# Patient Record
Sex: Male | Born: 1990 | Race: White | Hispanic: No | Marital: Single | Smoking: Former smoker
Health system: Southern US, Community
[De-identification: ages and names within clinical notes are randomized; demographics above are authoritative.]

---
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
## 2011-04-14 ENCOUNTER — Encounter: Payer: Self-pay | Admitting: Emergency Medicine

## 2011-04-14 ENCOUNTER — Emergency Department (HOSPITAL_COMMUNITY)
Admission: EM | Admit: 2011-04-14 | Discharge: 2011-04-14 | Disposition: A | Discharge status: Home / Self Care | Payer: BC Managed Care – PPO | Attending: Emergency Medicine | Admitting: Emergency Medicine

## 2011-04-14 ENCOUNTER — Emergency Department (HOSPITAL_COMMUNITY): Payer: BC Managed Care – PPO

## 2011-04-14 DIAGNOSIS — S20219A Contusion of unspecified front wall of thorax, initial encounter: Secondary | ICD-10-CM

## 2011-04-14 DIAGNOSIS — Y92410 Unspecified street and highway as the place of occurrence of the external cause: Secondary | ICD-10-CM | POA: Insufficient documentation

## 2011-04-14 DIAGNOSIS — R079 Chest pain, unspecified: Secondary | ICD-10-CM | POA: Insufficient documentation

## 2011-04-14 MED ORDER — NAPROXEN 500 MG PO TABS: 500.0000 mg | ORAL_TABLET | Freq: Two times a day (BID) | ORAL | Status: AC
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
## 2011-04-23 LAB — BASIC METABOLIC PANEL
CO2: 20
Calcium: 9.3
Chloride: 106
Glucose, Bld: 108 — ABNORMAL HIGH
Sodium: 136

## 2011-04-23 LAB — CBC
HCT: 44.1
Hemoglobin: 15.5
MCV: 90.8
Platelets: 262
WBC: 8.5

## 2011-04-23 LAB — DIFFERENTIAL
Basophils Relative: 0
Eosinophils Absolute: 0.1
Eosinophils Relative: 1
Lymphs Abs: 2.8
Monocytes Relative: 7
Neutrophils Relative %: 59

## 2013-09-22 ENCOUNTER — Emergency Department (HOSPITAL_COMMUNITY)
Admission: EM | Admit: 2013-09-22 | Discharge: 2013-09-22 | Disposition: A | Discharge status: Home / Self Care | Payer: Managed Care, Other (non HMO) | Attending: Emergency Medicine | Admitting: Emergency Medicine

## 2013-09-22 ENCOUNTER — Encounter (HOSPITAL_COMMUNITY): Payer: Self-pay | Admitting: Emergency Medicine

## 2013-09-22 DIAGNOSIS — R112 Nausea with vomiting, unspecified: Secondary | ICD-10-CM

## 2013-09-22 DIAGNOSIS — R5381 Other malaise: Secondary | ICD-10-CM | POA: Insufficient documentation

## 2013-09-22 DIAGNOSIS — R000 Tachycardia, unspecified: Secondary | ICD-10-CM | POA: Insufficient documentation

## 2013-09-22 DIAGNOSIS — R197 Diarrhea, unspecified: Secondary | ICD-10-CM | POA: Insufficient documentation

## 2013-09-22 DIAGNOSIS — R109 Unspecified abdominal pain: Secondary | ICD-10-CM

## 2013-09-22 DIAGNOSIS — E860 Dehydration: Secondary | ICD-10-CM | POA: Insufficient documentation

## 2013-09-22 DIAGNOSIS — R6883 Chills (without fever): Secondary | ICD-10-CM | POA: Insufficient documentation

## 2013-09-22 DIAGNOSIS — R51 Headache: Secondary | ICD-10-CM | POA: Insufficient documentation

## 2013-09-22 DIAGNOSIS — R1032 Left lower quadrant pain: Secondary | ICD-10-CM | POA: Insufficient documentation

## 2013-09-22 DIAGNOSIS — R5383 Other fatigue: Secondary | ICD-10-CM

## 2013-09-22 LAB — CBC WITH DIFFERENTIAL/PLATELET
BASOS ABS: 0 10*3/uL (ref 0.0–0.1)
BASOS PCT: 0 % (ref 0–1)
BASOS PCT: 0 % (ref 0–1)
Basophils Absolute: 0 10*3/uL (ref 0.0–0.1)
EOS ABS: 0 10*3/uL (ref 0.0–0.7)
EOS ABS: 0 10*3/uL (ref 0.0–0.7)
EOS PCT: 0 % (ref 0–5)
Eosinophils Relative: 0 % (ref 0–5)
HEMATOCRIT: 50.4 % (ref 39.0–52.0)
HEMATOCRIT: 48.5 % (ref 39.0–52.0)
Hemoglobin: 18 g/dL — ABNORMAL HIGH (ref 13.0–17.0)
Hemoglobin: 16.9 g/dL (ref 13.0–17.0)
LYMPHS PCT: 2 % — AB (ref 12–46)
Lymphocytes Relative: 3 % — ABNORMAL LOW (ref 12–46)
Lymphs Abs: 0.4 10*3/uL — ABNORMAL LOW (ref 0.7–4.0)
Lymphs Abs: 0.3 10*3/uL — ABNORMAL LOW (ref 0.7–4.0)
MCH: 32.6 pg (ref 26.0–34.0)
MCH: 32.4 pg (ref 26.0–34.0)
MCHC: 35.7 g/dL (ref 30.0–36.0)
MCHC: 34.8 g/dL (ref 30.0–36.0)
MCV: 91.3 fL (ref 78.0–100.0)
MCV: 92.9 fL (ref 78.0–100.0)
MONO ABS: 0.5 10*3/uL (ref 0.1–1.0)
MONOS PCT: 4 % (ref 3–12)
Monocytes Absolute: 0.4 10*3/uL (ref 0.1–1.0)
Monocytes Relative: 3 % (ref 3–12)
NEUTROS ABS: 10.2 10*3/uL — AB (ref 1.7–7.7)
Neutro Abs: 14.3 10*3/uL — ABNORMAL HIGH (ref 1.7–7.7)
Neutrophils Relative %: 94 % — ABNORMAL HIGH (ref 43–77)
Neutrophils Relative %: 94 % — ABNORMAL HIGH (ref 43–77)
PLATELETS: 230 10*3/uL (ref 150–400)
Platelets: 230 10*3/uL (ref 150–400)
RBC: 5.52 MIL/uL (ref 4.22–5.81)
RBC: 5.22 MIL/uL (ref 4.22–5.81)
RDW: 12.9 % (ref 11.5–15.5)
RDW: 13 % (ref 11.5–15.5)
WBC: 15.2 10*3/uL — AB (ref 4.0–10.5)
WBC: 10.8 10*3/uL — AB (ref 4.0–10.5)

## 2013-09-22 LAB — COMPREHENSIVE METABOLIC PANEL
ALBUMIN: 4.1 g/dL (ref 3.5–5.2)
ALBUMIN: 4.1 g/dL (ref 3.5–5.2)
ALK PHOS: 75 U/L (ref 39–117)
ALK PHOS: 65 U/L (ref 39–117)
ALT: 98 U/L — ABNORMAL HIGH (ref 0–53)
ALT: 97 U/L — AB (ref 0–53)
AST: 55 U/L — ABNORMAL HIGH (ref 0–37)
AST: 51 U/L — ABNORMAL HIGH (ref 0–37)
BILIRUBIN TOTAL: 1 mg/dL (ref 0.3–1.2)
BUN: 12 mg/dL (ref 6–23)
BUN: 14 mg/dL (ref 6–23)
CALCIUM: 9.5 mg/dL (ref 8.4–10.5)
CO2: 21 mEq/L (ref 19–32)
CO2: 26 meq/L (ref 19–32)
CREATININE: 0.92 mg/dL (ref 0.50–1.35)
Calcium: 9.5 mg/dL (ref 8.4–10.5)
Chloride: 102 mEq/L (ref 96–112)
Chloride: 96 mEq/L (ref 96–112)
Creatinine, Ser: 1.01 mg/dL (ref 0.50–1.35)
GFR calc Af Amer: >90 mL/min (ref >90)
GFR calc Af Amer: >90 mL/min (ref >90)
GFR calc non Af Amer: >90 mL/min (ref >90)
GLUCOSE: 117 mg/dL — AB (ref 70–99)
Glucose, Bld: 127 mg/dL — ABNORMAL HIGH (ref 70–99)
POTASSIUM: 3.8 meq/L (ref 3.7–5.3)
Potassium: 4.1 mEq/L (ref 3.7–5.3)
Sodium: 138 mEq/L (ref 137–147)
Sodium: 137 mEq/L (ref 137–147)
TOTAL PROTEIN: 8.1 g/dL (ref 6.0–8.3)
Total Bilirubin: 0.9 mg/dL (ref 0.3–1.2)
Total Protein: 8.3 g/dL (ref 6.0–8.3)

## 2013-09-22 LAB — URINALYSIS, ROUTINE W REFLEX MICROSCOPIC
Bilirubin Urine: NEGATIVE
Glucose, UA: NEGATIVE mg/dL
HGB URINE DIPSTICK: NEGATIVE
KETONES UR: NEGATIVE mg/dL
Leukocytes, UA: NEGATIVE
Nitrite: NEGATIVE
PH: 6 (ref 5.0–8.0)
PROTEIN: NEGATIVE mg/dL
Specific Gravity, Urine: >1.03 — ABNORMAL HIGH (ref 1.005–1.030)
UROBILINOGEN UA: 0.2 mg/dL (ref 0.0–1.0)

## 2013-09-22 LAB — LIPASE, BLOOD
Lipase: 29 U/L (ref 11–59)
Lipase: 17 U/L (ref 11–59)

## 2013-09-22 MED ORDER — MORPHINE SULFATE 4 MG/ML IJ SOLN
6.0000 mg | Freq: Once | INTRAMUSCULAR | Status: AC
  Administered 2013-09-22: 6 mg via INTRAVENOUS
  Filled 2013-09-22: qty 2

## 2013-09-22 MED ORDER — PROMETHAZINE HCL 25 MG RE SUPP: 25.0000 mg | Freq: Four times a day (QID) | RECTAL | Status: DC | PRN

## 2013-09-22 MED ORDER — PROMETHAZINE HCL 25 MG/ML IJ SOLN
INTRAMUSCULAR | Status: DC
Start: 2013-09-22 — End: 2013-09-23
  Filled 2013-09-22: qty 1

## 2013-09-22 MED ORDER — METOCLOPRAMIDE HCL 5 MG/ML IJ SOLN
10.0000 mg | Freq: Once | INTRAMUSCULAR | Status: AC
  Administered 2013-09-22: 10 mg via INTRAVENOUS
  Filled 2013-09-22: qty 2

## 2013-09-22 MED ORDER — SODIUM CHLORIDE 0.9 % IV BOLUS (SEPSIS)
1000.0000 mL | Freq: Once | INTRAVENOUS | Status: AC
  Administered 2013-09-22: 1000 mL via INTRAVENOUS

## 2013-09-22 MED ORDER — SODIUM CHLORIDE 0.9 % IV SOLN
1000.0000 mL | Freq: Once | INTRAVENOUS | Status: AC
  Administered 2013-09-22: 1000 mL via INTRAVENOUS

## 2013-09-22 MED ORDER — SODIUM CHLORIDE 0.9 % IV SOLN
1000.0000 mL | INTRAVENOUS | Status: DC
Start: 2013-09-22 — End: 2013-09-22

## 2013-09-22 MED ORDER — ONDANSETRON 8 MG PO TBDP: 8.0000 mg | ORAL_TABLET | Freq: Three times a day (TID) | ORAL | Status: DC | PRN

## 2013-09-22 MED ORDER — DIPHENHYDRAMINE HCL 50 MG/ML IJ SOLN
25.0000 mg | Freq: Once | INTRAMUSCULAR | Status: AC
  Administered 2013-09-22: 25 mg via INTRAVENOUS
  Filled 2013-09-22: qty 1

## 2013-09-22 MED ORDER — PROMETHAZINE HCL 25 MG/ML IJ SOLN
12.5000 mg | Freq: Once | INTRAMUSCULAR | Status: AC
  Administered 2013-09-22: 12.5 mg via INTRAVENOUS

## 2013-09-22 MED ORDER — ONDANSETRON HCL 4 MG/2ML IJ SOLN
4.0000 mg | Freq: Once | INTRAMUSCULAR | Status: AC
  Administered 2013-09-22: 4 mg via INTRAVENOUS
  Filled 2013-09-22: qty 2

## 2013-09-22 MED ORDER — SODIUM CHLORIDE 0.9 % IV SOLN
1000.0000 mL | INTRAVENOUS | Status: DC
  Administered 2013-09-22: 1000 mL via INTRAVENOUS

## 2013-09-22 MED ORDER — LOPERAMIDE HCL 2 MG PO CAPS
2.0000 mg | ORAL_CAPSULE | Freq: Once | ORAL | Status: AC
  Administered 2013-09-22: 2 mg via ORAL
  Filled 2013-09-22: qty 1

## 2013-09-22 NOTE — ED Provider Notes (Signed)
CSN: [id number]     Arrival date & time 09/22/13  1345 History  This chart was scribed for Ward Givens, MD by Leone Payor, ED Scribe. This patient was seen in room APA15/APA15 and the patient's care was started 3:51 PM.    Arthur Complaint  Patient presents with  . Emesis      The history is provided by the patient. No language interpreter was used.    HPI Comments: Arthur Walsh is a 23 y.o. male who presents to the Emergency Department complaining of 14 hours of nausea (started at 2 am) and vomiting with associated abdominal pain and diarrhea. He reports having > 20 episodes of vomiting and about 4-5 episodes of watery diarrhea. He also reports associated chills and generalized weakness upon exertion. He only has diffuse abdominal pain while vomiting. Pt was seen here this morning for the same symptoms and was discharged home. He reports since he was discharged he has developed a constant, frontal HA that does not throb. He denies lightheadedness, dizziness, visual disturbances, numbness or tingling sensations in the extremities, fever, dysuria, frequency. He does feel very weak.  He denies sick contacts. He is unsure if he had suspect food intake. He denies smoking or alcohol use.   PCP Dr Gerda Diss  History reviewed. No pertinent past medical history. History reviewed. No pertinent past surgical history. No family history on file. History  Substance Use Topics  . Smoking status: Never Smoker   . Smokeless tobacco: Not on file  . Alcohol Use: No  employed making door frames  Review of Systems  Constitutional: Positive for chills. Negative for fever.  Eyes: Negative for visual disturbance.  Gastrointestinal: Positive for nausea, vomiting, abdominal pain and diarrhea.  Neurological: Positive for weakness (generalized) and headaches. Negative for dizziness, light-headedness and numbness.      Allergies  Review of patient's allergies indicates no known allergies.  Home Medications    Current Outpatient Rx  Name  Route  Sig  Dispense  Refill  . ondansetron (ZOFRAN ODT) 8 MG disintegrating tablet   Oral   Take 1 tablet (8 mg total) by mouth every 8 (eight) hours as needed for nausea or vomiting.   12 tablet   0    BP 137/79  Pulse 110  Resp 18  Ht 5\' 6"  (1.676 m)  Wt 240 lb (108.863 kg)  BMI 38.76 kg/m2  SpO2 99%  Vital signs normal except tachycardia    Physical Exam  Nursing note and vitals reviewed. Constitutional: He is oriented to person, place, and time. He appears well-developed and well-nourished.  Non-toxic appearance. He does not appear ill. No distress.  HENT:  Head: Normocephalic and atraumatic.  Right Ear: External ear normal.  Left Ear: External ear normal.  Nose: Nose normal. No mucosal edema or rhinorrhea.  Mouth/Throat: Oropharynx is clear and moist and mucous membranes are normal. No dental abscesses or uvula swelling.  Eyes: Conjunctivae and EOM are normal. Pupils are equal, round, and reactive to light.  Neck: Normal range of motion and full passive range of motion without pain. Neck supple.  Cardiovascular: Regular rhythm and normal heart sounds.  Tachycardia present.  Exam reveals no gallop and no friction rub.   No murmur heard. Pulmonary/Chest: Effort normal and breath sounds normal. No respiratory distress. He has no wheezes. He has no rhonchi. He has no rales. He exhibits no tenderness and no crepitus.  Abdominal: Soft. Normal appearance and bowel sounds are normal. He exhibits no distension.  There is tenderness (mild LLQ tenderness to palpation ). There is no rebound and no guarding.  Musculoskeletal: Normal range of motion. He exhibits no edema and no tenderness.  Moves all extremities well.   Neurological: He is alert and oriented to person, place, and time. He has normal strength. No cranial nerve deficit.  Skin: Skin is warm, dry and intact. No rash noted. No erythema. No pallor.  Psychiatric: He has a normal mood and  affect. His speech is normal and behavior is normal. His mood appears not anxious.    ED Course  Procedures (including critical care time)  Medications  0.9 %  sodium chloride infusion (0 mLs Intravenous Stopped 09/22/13 1754)    Followed by  0.9 %  sodium chloride infusion (0 mLs Intravenous Stopped 09/22/13 1754)    Followed by  0.9 %  sodium chloride infusion (0 mLs Intravenous Stopped 09/22/13 1911)  metoCLOPramide (REGLAN) injection 10 mg (10 mg Intravenous Given 09/22/13 1637)  diphenhydrAMINE (BENADRYL) injection 25 mg (25 mg Intravenous Given 09/22/13 1635)  ondansetron (ZOFRAN) injection 4 mg (4 mg Intravenous Given 09/22/13 1909)  sodium chloride 0.9 % bolus 1,000 mL (0 mLs Intravenous Stopped 09/22/13 2027)  promethazine (PHENERGAN) injection 12.5 mg (12.5 mg Intravenous Given 09/22/13 2025)  loperamide (IMODIUM) capsule 2 mg (2 mg Oral Given 09/22/13 2027)     DIAGNOSTIC STUDIES: Oxygen Saturation is 98% on RA, normal by my interpretation.    COORDINATION OF CARE: 3:53 PM Discussed treatment plan with pt at bedside and pt agreed to plan.  Pt was checked a couple of times. He didn't have complete relief of nausea with Reglan and Benadryl. He was given Zofran with no change in his nausea. He was finally given Phenergan and he was able to drink 2 cups of fluids. He received 4 bags of normal saline while in the emergency department. At time of discharge he felt improved enough to go home.   Labs Review   Results for orders placed during the hospital encounter of 09/22/13  CBC WITH DIFFERENTIAL      Result Value Ref Range   WBC 10.8 (*) 4.0 - 10.5 K/uL   RBC 5.22  4.22 - 5.81 MIL/uL   Hemoglobin 16.9  13.0 - 17.0 g/dL   HCT 16.1  09.6 - 04.5 %   MCV 92.9  78.0 - 100.0 fL   MCH 32.4  26.0 - 34.0 pg   MCHC 34.8  30.0 - 36.0 g/dL   RDW 40.9  81.1 - 91.4 %   Platelets 230  150 - 400 K/uL   Neutrophils Relative % 94 (*) 43 - 77 %   Neutro Abs 10.2 (*) 1.7 - 7.7 K/uL    Lymphocytes Relative 3 (*) 12 - 46 %   Lymphs Abs 0.3 (*) 0.7 - 4.0 K/uL   Monocytes Relative 3  3 - 12 %   Monocytes Absolute 0.4  0.1 - 1.0 K/uL   Eosinophils Relative 0  0 - 5 %   Eosinophils Absolute 0.0  0.0 - 0.7 K/uL   Basophils Relative 0  0 - 1 %   Basophils Absolute 0.0  0.0 - 0.1 K/uL  COMPREHENSIVE METABOLIC PANEL      Result Value Ref Range   Sodium 137  137 - 147 mEq/L   Potassium 3.8  3.7 - 5.3 mEq/L   Chloride 96  96 - 112 mEq/L   CO2 26  19 - 32 mEq/L   Glucose, Bld 117 (*) 70 -  99 mg/dL   BUN 14  6 - 23 mg/dL   Creatinine, Ser 1.61  0.50 - 1.35 mg/dL   Calcium 9.5  8.4 - 09.6 mg/dL   Total Protein 8.3  6.0 - 8.3 g/dL   Albumin 4.1  3.5 - 5.2 g/dL   AST 51 (*) 0 - 37 U/L   ALT 97 (*) 0 - 53 U/L   Alkaline Phosphatase 65  39 - 117 U/L   Total Bilirubin 1.0  0.3 - 1.2 mg/dL   GFR calc non Af Amer >90  >90 mL/min   GFR calc Af Amer >90  >90 mL/min  LIPASE, BLOOD      Result Value Ref Range   Lipase 17  11 - 59 U/L  URINALYSIS, ROUTINE W REFLEX MICROSCOPIC      Result Value Ref Range   Color, Urine YELLOW  YELLOW   APPearance CLEAR  CLEAR   Specific Gravity, Urine >1.030 (*) 1.005 - 1.030   pH 6.0  5.0 - 8.0   Glucose, UA NEGATIVE  NEGATIVE mg/dL   Hgb urine dipstick NEGATIVE  NEGATIVE   Bilirubin Urine NEGATIVE  NEGATIVE   Ketones, ur NEGATIVE  NEGATIVE mg/dL   Protein, ur NEGATIVE  NEGATIVE mg/dL   Urobilinogen, UA 0.2  0.0 - 1.0 mg/dL   Nitrite NEGATIVE  NEGATIVE   Leukocytes, UA NEGATIVE  NEGATIVE   Laboratory interpretation all normal except improving leukocytosis, concentrated urine consistent with dehydration       Results for orders placed during the hospital encounter of 09/22/13  CBC WITH DIFFERENTIAL      Result Value Ref Range   WBC 15.2 (*) 4.0 - 10.5 K/uL   RBC 5.52  4.22 - 5.81 MIL/uL   Hemoglobin 18.0 (*) 13.0 - 17.0 g/dL   HCT 04.5  40.9 - 81.1 %   MCV 91.3  78.0 - 100.0 fL   MCH 32.6  26.0 - 34.0 pg   MCHC 35.7  30.0 - 36.0  g/dL   RDW 91.4  78.2 - 95.6 %   Platelets 230  150 - 400 K/uL   Neutrophils Relative % 94 (*) 43 - 77 %   Neutro Abs 14.3 (*) 1.7 - 7.7 K/uL   Lymphocytes Relative 2 (*) 12 - 46 %   Lymphs Abs 0.4 (*) 0.7 - 4.0 K/uL   Monocytes Relative 4  3 - 12 %   Monocytes Absolute 0.5  0.1 - 1.0 K/uL   Eosinophils Relative 0  0 - 5 %   Eosinophils Absolute 0.0  0.0 - 0.7 K/uL   Basophils Relative 0  0 - 1 %   Basophils Absolute 0.0  0.0 - 0.1 K/uL  COMPREHENSIVE METABOLIC PANEL      Result Value Ref Range   Sodium 138  137 - 147 mEq/L   Potassium 4.1  3.7 - 5.3 mEq/L   Chloride 102  96 - 112 mEq/L   CO2 21  19 - 32 mEq/L   Glucose, Bld 127 (*) 70 - 99 mg/dL   BUN 12  6 - 23 mg/dL   Creatinine, Ser 2.13  0.50 - 1.35 mg/dL   Calcium 9.5  8.4 - 08.6 mg/dL   Total Protein 8.1  6.0 - 8.3 g/dL   Albumin 4.1  3.5 - 5.2 g/dL   AST 55 (*) 0 - 37 U/L   ALT 98 (*) 0 - 53 U/L   Alkaline Phosphatase 75  39 - 117 U/L   Total  Bilirubin 0.9  0.3 - 1.2 mg/dL   GFR calc non Af Amer >90  >90 mL/min   GFR calc Af Amer >90  >90 mL/min  LIPASE, BLOOD      Result Value Ref Range   Lipase 29  11 - 59 U/L      Imaging Review No results found.   EKG Interpretation None      MDM   Final diagnoses:  Nausea vomiting and diarrhea  Dehydration     New Prescriptions   PROMETHAZINE (PHENERGAN) 25 MG SUPPOSITORY    Place 1 suppository (25 mg total) rectally every 6 (six) hours as needed for nausea or vomiting.   OTC imodium  Plan discharge   Devoria AlbeIva Paiton Boultinghouse, MD, FACEP   I personally performed the services described in this documentation, which was scribed in my presence. The recorded information has been reviewed and considered.  Devoria AlbeIva Jaelen Soth, MD, Armando GangFACEP   Ward GivensIva L Halston Kintz, MD 09/22/13 2224

## 2013-09-22 NOTE — ED Notes (Signed)
Per mother pt has not been able to keep down water. Pt has been taking zofran and waiting, still nausea. Pt complaining of headache and abdominal radiating up into chest.

## 2013-09-22 NOTE — ED Provider Notes (Signed)
CSN: [id number]     Arrival date & time 09/22/13  0725 History   First MD Initiated Contact with Patient 09/22/13 [phone]     Chief Complaint  Patient presents with  . Abdominal Pain      HPI Pt reports awakening with n/v/d today. No fevers or chills. Developed some mild upper abdominal pain associated with vomiting. No CP. No SOB. No other complaints. No lower abdominal pain. No sick contacts. Nothing worsens or improves symptoms   History reviewed. No pertinent past medical history. History reviewed. No pertinent past surgical history. No family history on file. History  Substance Use Topics  . Smoking status: Never Smoker   . Smokeless tobacco: Not on file  . Alcohol Use: No    Review of Systems  All other systems reviewed and are negative.      Allergies  Review of patient's allergies indicates no known allergies.  Home Medications   Current Outpatient Rx  Name  Route  Sig  Dispense  Refill  . ondansetron (ZOFRAN ODT) 8 MG disintegrating tablet   Oral   Take 1 tablet (8 mg total) by mouth every 8 (eight) hours as needed for nausea or vomiting.   12 tablet   0    BP 121/77  Pulse 118  Temp(Src) 98.2 F (36.8 C) (Oral)  Resp 18  Ht 5\' 6"  (1.676 m)  Wt 240 lb (108.863 kg)  BMI 38.76 kg/m2  SpO2 98% Physical Exam  Nursing note and vitals reviewed. Constitutional: He is oriented to person, place, and time. He appears well-developed and well-nourished.  HENT:  Head: Normocephalic and atraumatic.  Eyes: EOM are normal.  Neck: Normal range of motion.  Cardiovascular: Normal rate, regular rhythm, normal heart sounds and intact distal pulses.   Pulmonary/Chest: Effort normal and breath sounds normal. No respiratory distress.  Abdominal: Soft. He exhibits no distension. There is no tenderness.  Musculoskeletal: Normal range of motion.  Neurological: He is alert and oriented to person, place, and time.  Skin: Skin is warm and dry.  Psychiatric: He has a normal  mood and affect. Judgment normal.    ED Course  Procedures (including critical care time) Labs Review Labs Reviewed  CBC WITH DIFFERENTIAL - Abnormal; Notable for the following:    WBC 15.2 (*)    Hemoglobin 18.0 (*)    Neutrophils Relative % 94 (*)    Neutro Abs 14.3 (*)    Lymphocytes Relative 2 (*)    Lymphs Abs 0.4 (*)    All other components within normal limits  COMPREHENSIVE METABOLIC PANEL - Abnormal; Notable for the following:    Glucose, Bld 127 (*)    AST 55 (*)    ALT 98 (*)    All other components within normal limits  LIPASE, BLOOD   Imaging Review No results found.  EKG Interpretation  None  MDM   Final diagnoses:  Nausea vomiting and diarrhea  Abdominal pain    8:51 AM Pt feels better. Dc home in good condition. Likely viral process. i personally performed bedside US to evaluate his GB. No pericholecystic fluid, no GB wall thickening, no stones noted. CBD not visualized. No sonographic murphys present    Lyanne CoKevin M Ellan Tess, MD 09/22/13 [phone]

## 2013-09-22 NOTE — ED Notes (Signed)
Pt states he woke up this morning with n/v/d

## 2013-09-22 NOTE — ED Notes (Signed)
Pt drinking ginger ale  

## 2013-09-22 NOTE — ED Notes (Signed)
Complain of vomiting and abdominal pain. Pt was here earlier for same

## 2013-09-22 NOTE — ED Notes (Signed)
Patient with no complaints at this time. Respirations even and unlabored. Skin warm/dry. Discharge instructions reviewed with patient at this time. Patient given opportunity to voice concerns/ask questions. IV removed per policy and band-aid applied to site. Patient discharged at this time and left Emergency Department with steady gait.  

## 2013-09-22 NOTE — Discharge Instructions (Signed)
Drink plenty of fluids (clear liquids) the next 12-24 hours and eat bland food such as crackers, toast, jello.  Use the zofran and phenergan for nausea or vomiting. Take imodium OTC for diarrhea. Avoid mild products until the diarrhea is gone. Recheck if you get worse again. .Marland Kitchen

## 2013-09-22 NOTE — Discharge Instructions (Signed)
Abdominal Pain, Adult °Many things can cause abdominal pain. Usually, abdominal pain is not caused by a disease and will improve without treatment. It can often be observed and treated at home. Your health care provider will do a physical exam and possibly order blood tests and X-rays to help determine the seriousness of your pain. However, in many cases, more time must pass before a clear cause of the pain can be found. Before that point, your health care provider may not know if you need more testing or further treatment. °HOME CARE INSTRUCTIONS  °Monitor your abdominal pain for any changes. The following actions may help to alleviate any discomfort you are experiencing: °· Only take over-the-counter or prescription medicines as directed by your health care provider. °· Do not take laxatives unless directed to do so by your health care provider. °· Try a clear liquid diet (broth, tea, or water) as directed by your health care provider. Slowly move to a bland diet as tolerated. °SEEK MEDICAL CARE IF: °· You have unexplained abdominal pain. °· You have abdominal pain associated with nausea or diarrhea. °· You have pain when you urinate or have a bowel movement. °· You experience abdominal pain that wakes you in the night. °· You have abdominal pain that is worsened or improved by eating food. °· You have abdominal pain that is worsened with eating fatty foods. °SEEK IMMEDIATE MEDICAL CARE IF:  °· Your pain does not go away within 2 hours. °· You have a fever. °· You keep throwing up (vomiting). °· Your pain is felt only in portions of the abdomen, such as the right side or the left lower portion of the abdomen. °· You pass bloody or black tarry stools. °MAKE SURE YOU: °· Understand these instructions.   °· Will watch your condition.   °· Will get help right away if you are not doing well or get worse.   °Document Released: 04/22/2005 Document Revised: 05/03/2013 Document Reviewed: 03/22/2013 °ExitCare® Patient  Information ©2014 [employer]. ° °Nausea and Vomiting °Nausea is a sick feeling that often comes before throwing up (vomiting). Vomiting is a reflex where stomach contents come out of your mouth. Vomiting can cause severe loss of body fluids (dehydration). Children and elderly adults can become dehydrated quickly, especially if they also have diarrhea. Nausea and vomiting are symptoms of a condition or disease. It is important to find the cause of your symptoms. °CAUSES  °· Direct irritation of the stomach lining. This irritation can result from increased acid production (gastroesophageal reflux disease), infection, food poisoning, taking certain medicines (such as nonsteroidal anti-inflammatory drugs), alcohol use, or tobacco use. °· Signals from the brain. These signals could be caused by a headache, heat exposure, an inner ear disturbance, increased pressure in the brain from injury, infection, a tumor, or a concussion, pain, emotional stimulus, or metabolic problems. °· An obstruction in the gastrointestinal tract (bowel obstruction). °· Illnesses such as diabetes, hepatitis, gallbladder problems, appendicitis, kidney problems, cancer, sepsis, atypical symptoms of a heart attack, or eating disorders. °· Medical treatments such as chemotherapy and radiation. °· Receiving medicine that makes you sleep (general anesthetic) during surgery. °DIAGNOSIS °Your caregiver may ask for tests to be done if the problems do not improve after a few days. Tests may also be done if symptoms are severe or if the reason for the nausea and vomiting is not clear. Tests may include: °· Urine tests. °· Blood tests. °· Stool tests. °· Cultures (to look for evidence of infection). °· X-rays or   other imaging studies. °Test results can help your caregiver make decisions about treatment or the need for additional tests. °TREATMENT °You need to stay well hydrated. Drink frequently but in small amounts. You may wish to drink water, sports  drinks, clear broth, or eat frozen ice pops or gelatin dessert to help stay hydrated. When you eat, eating slowly may help prevent nausea. There are also some antinausea medicines that may help prevent nausea. °HOME CARE INSTRUCTIONS  °· Take all medicine as directed by your caregiver. °· If you do not have an appetite, do not force yourself to eat. However, you must continue to drink fluids. °· If you have an appetite, eat a normal diet unless your caregiver tells you differently. °· Eat a variety of complex carbohydrates (rice, wheat, potatoes, bread), lean meats, yogurt, fruits, and vegetables. °· Avoid high-fat foods because they are more difficult to digest. °· Drink enough water and fluids to keep your urine clear or pale yellow. °· If you are dehydrated, ask your caregiver for specific rehydration instructions. Signs of dehydration may include: °· Severe thirst. °· Dry lips and mouth. °· Dizziness. °· Dark urine. °· Decreasing urine frequency and amount. °· Confusion. °· Rapid breathing or pulse. °SEEK IMMEDIATE MEDICAL CARE IF:  °· You have blood or brown flecks (like coffee grounds) in your vomit. °· You have black or bloody stools. °· You have a severe headache or stiff neck. °· You are confused. °· You have severe abdominal pain. °· You have chest pain or trouble breathing. °· You do not urinate at least once every 8 hours. °· You develop cold or clammy skin. °· You continue to vomit for longer than 24 to 48 hours. °· You have a fever. °MAKE SURE YOU:  °· Understand these instructions. °· Will watch your condition. °· Will get help right away if you are not doing well or get worse. °Document Released: 07/13/2005 Document Revised: 10/05/2011 Document Reviewed: 12/10/2010 °ExitCare® Patient Information ©2014 [employer]. ° °

## 2014-04-04 ENCOUNTER — Emergency Department (HOSPITAL_BASED_OUTPATIENT_CLINIC_OR_DEPARTMENT_OTHER): Payer: Worker's Compensation

## 2014-04-04 ENCOUNTER — Emergency Department (HOSPITAL_BASED_OUTPATIENT_CLINIC_OR_DEPARTMENT_OTHER)
Admission: EM | Admit: 2014-04-04 | Discharge: 2014-04-05 | Disposition: A | Discharge status: Home / Self Care | Payer: Worker's Compensation | Attending: Emergency Medicine | Admitting: Emergency Medicine

## 2014-04-04 ENCOUNTER — Encounter (HOSPITAL_BASED_OUTPATIENT_CLINIC_OR_DEPARTMENT_OTHER): Payer: Self-pay | Admitting: Emergency Medicine

## 2014-04-04 DIAGNOSIS — Y990 Civilian activity done for income or pay: Secondary | ICD-10-CM | POA: Diagnosis not present

## 2014-04-04 DIAGNOSIS — S46909A Unspecified injury of unspecified muscle, fascia and tendon at shoulder and upper arm level, unspecified arm, initial encounter: Secondary | ICD-10-CM | POA: Diagnosis present

## 2014-04-04 DIAGNOSIS — Y9289 Other specified places as the place of occurrence of the external cause: Secondary | ICD-10-CM | POA: Diagnosis not present

## 2014-04-04 DIAGNOSIS — S4980XA Other specified injuries of shoulder and upper arm, unspecified arm, initial encounter: Secondary | ICD-10-CM | POA: Diagnosis present

## 2014-04-04 DIAGNOSIS — X500XXA Overexertion from strenuous movement or load, initial encounter: Secondary | ICD-10-CM | POA: Insufficient documentation

## 2014-04-04 DIAGNOSIS — Y9389 Activity, other specified: Secondary | ICD-10-CM | POA: Diagnosis not present

## 2014-04-04 DIAGNOSIS — S43401A Unspecified sprain of right shoulder joint, initial encounter: Secondary | ICD-10-CM

## 2014-04-04 DIAGNOSIS — F17200 Nicotine dependence, unspecified, uncomplicated: Secondary | ICD-10-CM | POA: Diagnosis not present

## 2014-04-04 DIAGNOSIS — IMO0002 Reserved for concepts with insufficient information to code with codable children: Secondary | ICD-10-CM | POA: Diagnosis not present

## 2014-04-04 MED ORDER — NAPROXEN 250 MG PO TABS
500.0000 mg | ORAL_TABLET | Freq: Once | ORAL | Status: AC
  Administered 2014-04-05: 500 mg via ORAL
  Filled 2014-04-04: qty 2

## 2014-04-04 NOTE — ED Provider Notes (Signed)
CSN: [id number]     Arrival date & time 04/04/14  2226 History  This chart was scribed for Hanley Seamen, MD by Modena Jansky, ED Scribe. This patient was seen in room MH09/MH09 and the patient's care was started at 11:51 PM.   Chief Complaint  Patient presents with  . Shoulder Injury   The history is provided by the patient. No language interpreter was used.   HPI Comments: Arthur Walsh is a 23 y.o. male who presents to the Emergency Department complaining of a right shoulder injury that occurred about 3 hours ago. He states that he was stacking wood at work when he heard a pop in his right shoulder. He reports that he keep on working and later on he noticed pain. He states that his supervisor advised him to come to the ED. Pain is mild to moderate, located in the posterior shoulder, and worse with movement or palpation. He describes it as aching.  PCP- Luking  History reviewed. No pertinent past medical history. History reviewed. No pertinent past surgical history. No family history on file. History  Substance Use Topics  . Smoking status: Current Some Day Smoker  . Smokeless tobacco: Not on file  . Alcohol Use: No    Review of Systems A complete 10 system review of systems was obtained and all systems are negative except as noted in the HPI and PMH.   Allergies  Review of patient's allergies indicates no known allergies.  Home Medications   Prior to Admission medications   Medication Sig Start Date End Date Taking? Authorizing Provider  ondansetron (ZOFRAN ODT) 8 MG disintegrating tablet Take 1 tablet (8 mg total) by mouth every 8 (eight) hours as needed for nausea or vomiting. 09/22/13   Lyanne Co, MD  promethazine (PHENERGAN) 25 MG suppository Place 1 suppository (25 mg total) rectally every 6 (six) hours as needed for nausea or vomiting. 09/22/13   Ward Givens, MD   BP 147/95  Pulse 88  Temp(Src) 99.2 F (37.3 C) (Oral)  Resp 20  Ht  (1.676 m)  Wt 240 lb  (108.863 kg)  BMI 38.76 kg/m2  SpO2 98% Physical Exam  Nursing note and vitals reviewed.  General: Well-developed, well-nourished male in no acute distress; appearance consistent with age of record HENT: normocephalic; atraumatic Eyes: pupils equal, round and reactive to light; extraocular muscles intact Neck: supple Heart: regular rate and rhythm; no murmurs, rubs or gallops Lungs: clear to auscultation bilaterally Abdomen: soft; nondistended; nontender; no masses or hepatosplenomegaly; bowel sounds present Extremities: No deformity; full range of motion; pulses normal. TTP right posterior shoulder soft tissue. No AC joint or other bony point tenderness of the right shoulder. Neurologic: Awake, alert and oriented; motor function intact in all extremities and symmetric; no facial droop Skin: Warm and dry Psychiatric: Normal mood and affect  ED Course  Procedures (including critical care time) DIAGNOSTIC STUDIES: Oxygen Saturation is 98% on RA, normal by my interpretation.    COORDINATION OF CARE: 11:55 PM- Pt advised of plan for treatment which includes medication and radiology and pt agrees.   MDM   Final diagnoses:  Sprain of right shoulder, initial encounter    Dg Shoulder Right  04/04/2014   CLINICAL DATA:  Injured right shoulder.  EXAM: RIGHT SHOULDER - 2+ VIEW  COMPARISON:  None.  FINDINGS: The joint spaces are maintained. No acute bony findings. No degenerative changes. The visualized right lung is clear.  IMPRESSION: No fracture or dislocation.  Electronically Signed   By: Loralie Champagne M.D.   On: 04/04/2014 22:51   I personally performed the services described in this documentation, which was scribed in my presence. The recorded information has been reviewed and is accurate.   Hanley Seamen, MD 04/05/14 [phone]

## 2014-04-04 NOTE — ED Notes (Signed)
Right shoulder pain after stacking wood at work-heard a pop

## 2014-04-24 ENCOUNTER — Ambulatory Visit: Payer: Self-pay | Admitting: Family Medicine

## 2015-06-10 IMAGING — CR DG SHOULDER 2+V*R*
3 series · 3 of 3 positions shown · non-contrast
Comparison: None.

CLINICAL DATA: Injured right shoulder.

EXAM:
RIGHT SHOULDER - 2+ VIEW

[w shoulder ap internal righ]
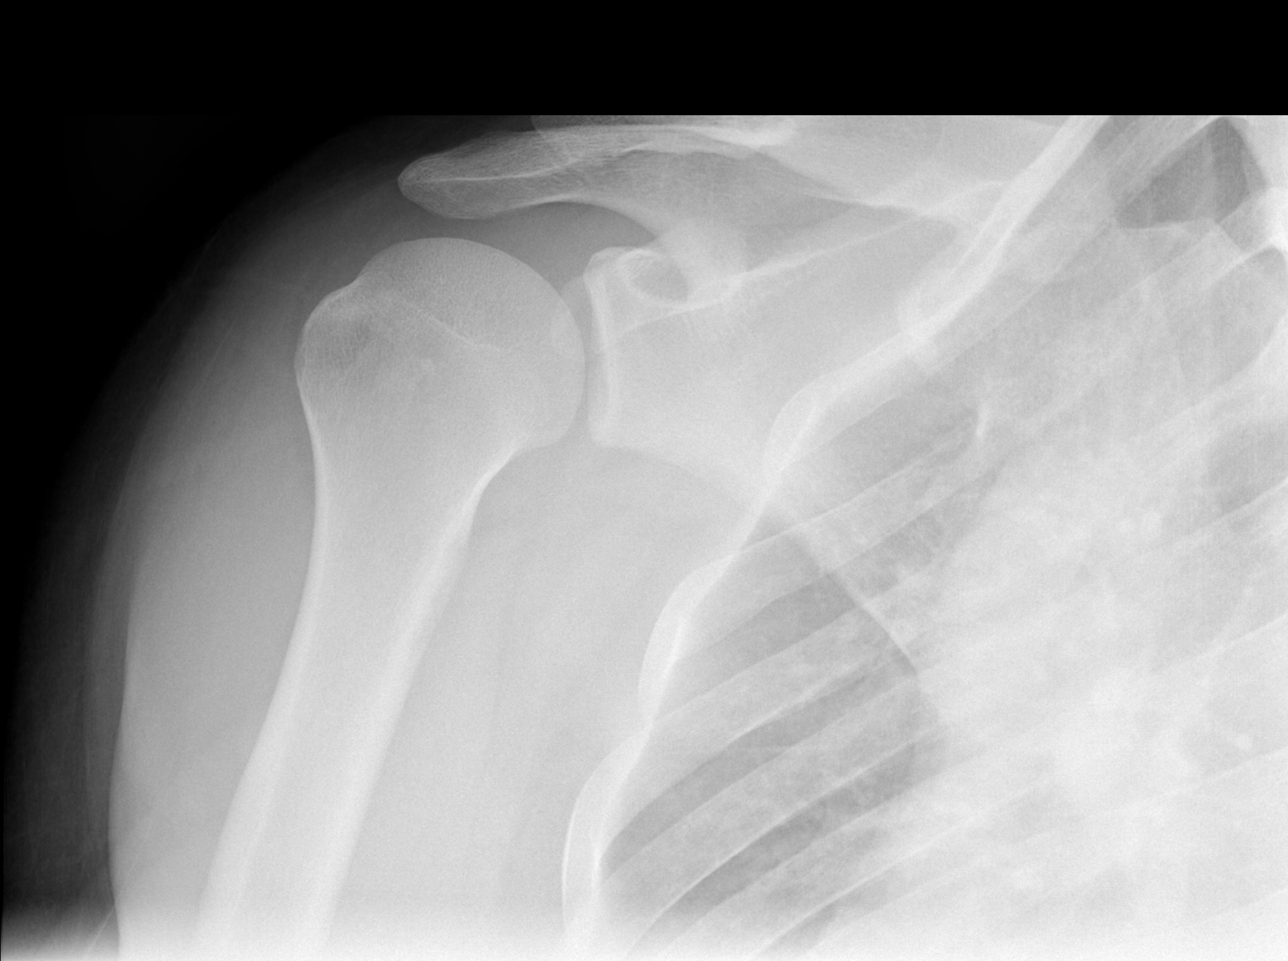

[w shoulder y view right]
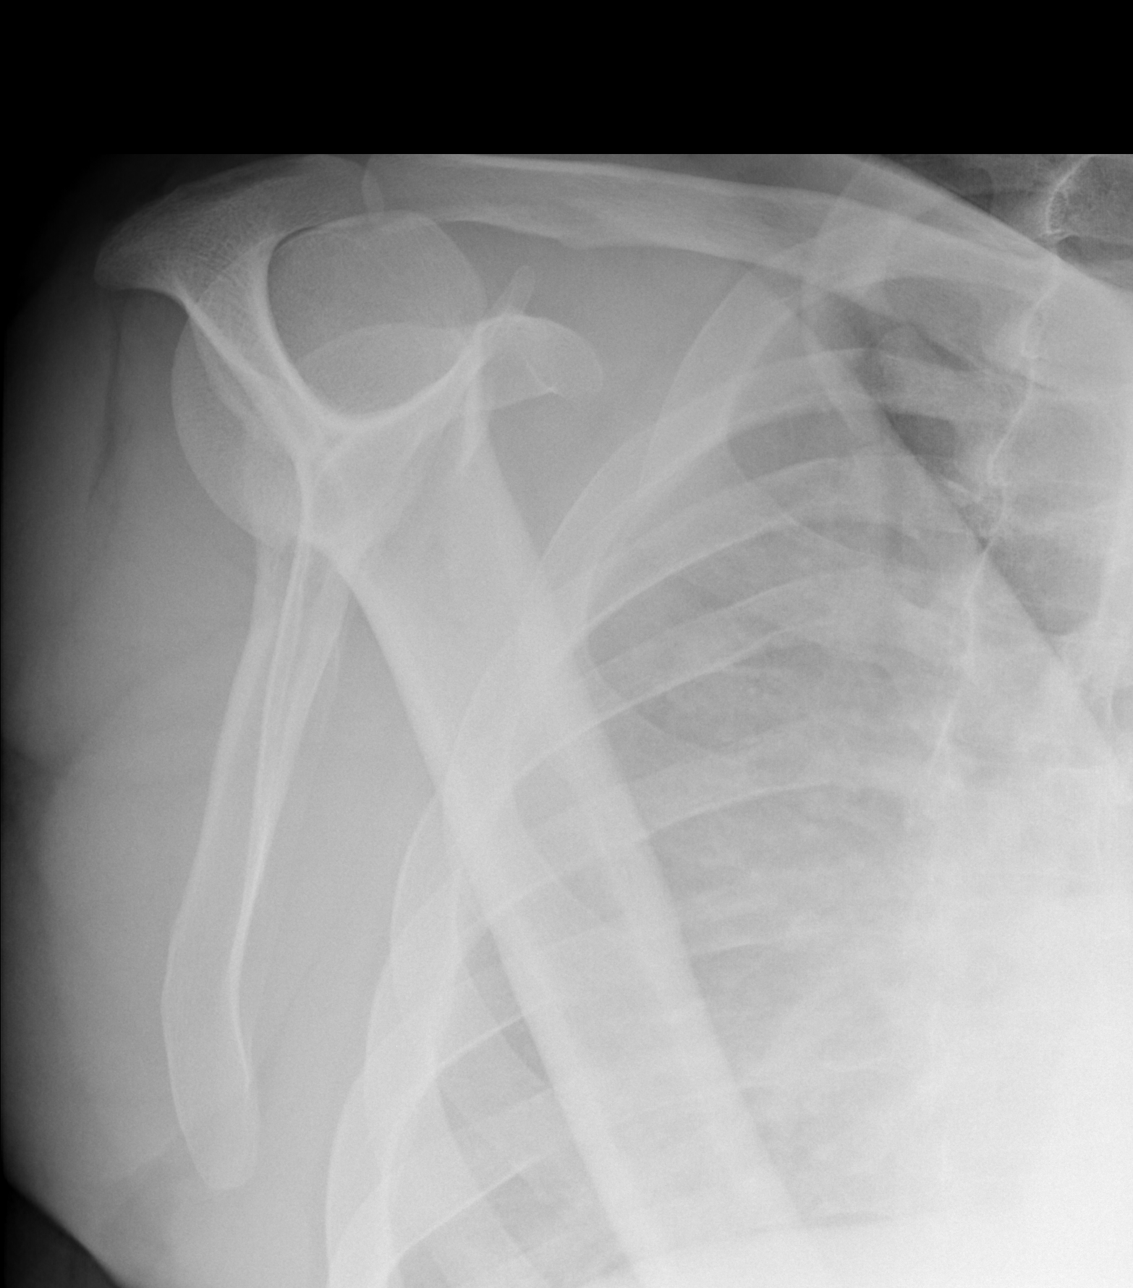

[x shoulder axillary right *]
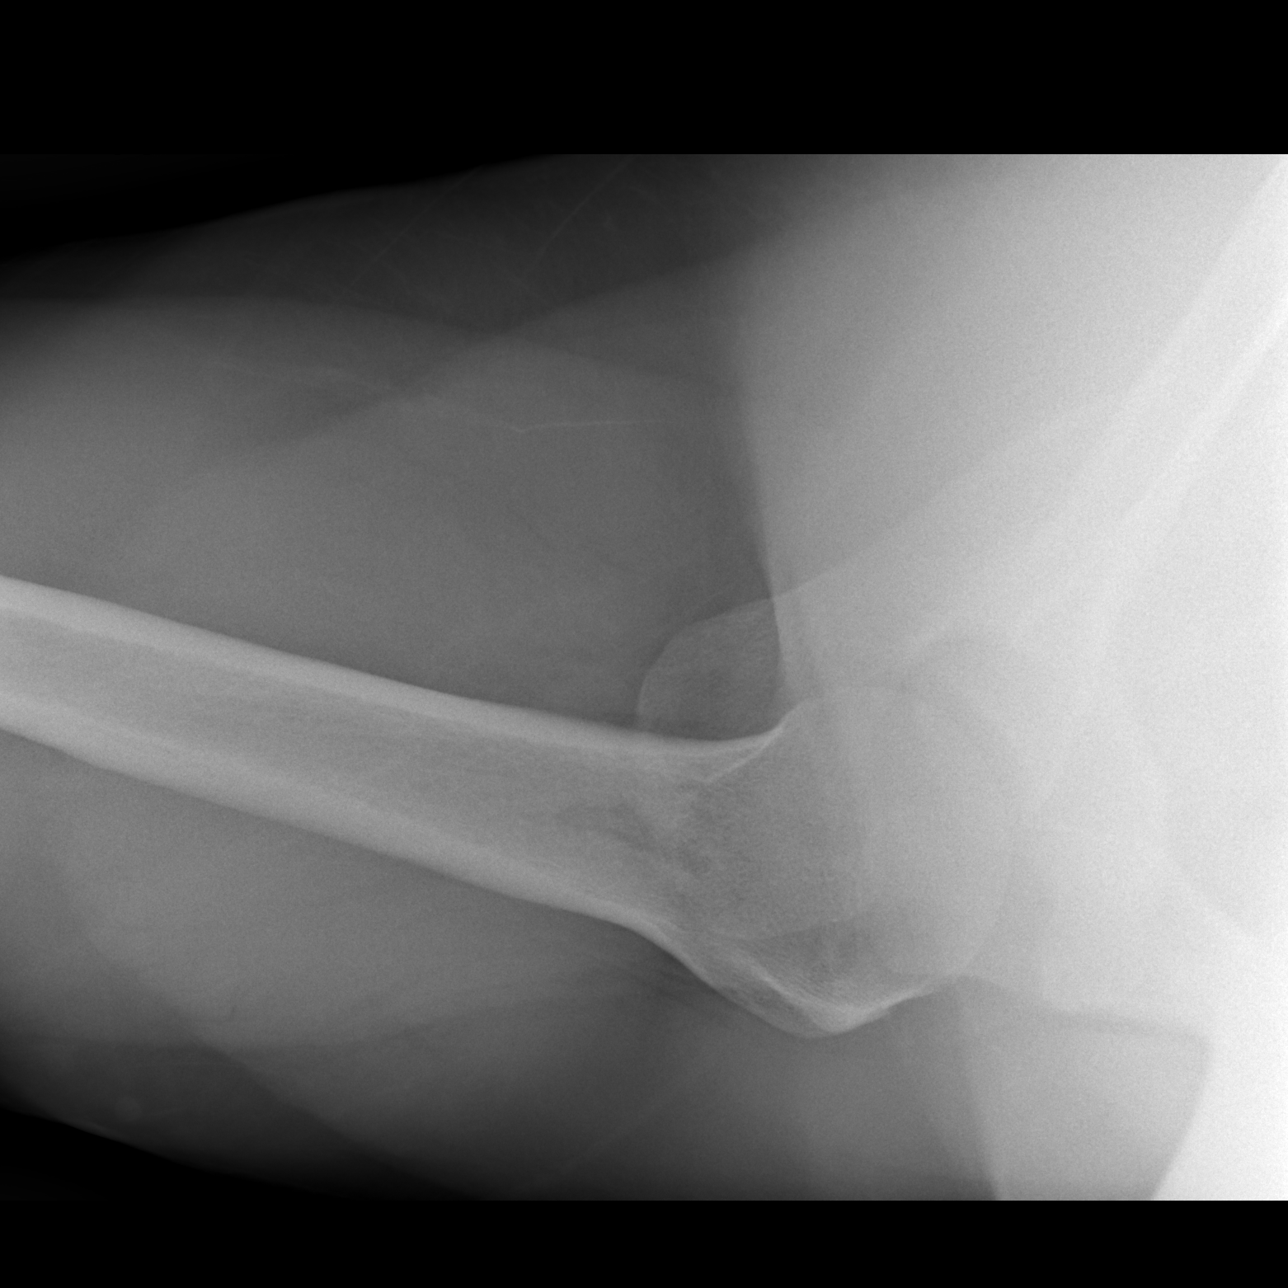

[3 of 3 positions shown; findings below may reference images not displayed]

FINDINGS: The joint spaces are maintained. No acute bony findings. No
degenerative changes. The visualized right lung is clear.
IMPRESSION: No fracture or dislocation.

## 2016-07-06 ENCOUNTER — Encounter: Payer: Self-pay | Admitting: Family Medicine

## 2016-07-06 ENCOUNTER — Ambulatory Visit (INDEPENDENT_AMBULATORY_CARE_PROVIDER_SITE_OTHER): Payer: 59 | Admitting: Family Medicine

## 2016-07-06 VITALS — BP 122/80 | Ht 66.0 in | Wt 263.0 lb

## 2016-07-06 DIAGNOSIS — T783XXA Angioneurotic edema, initial encounter: Secondary | ICD-10-CM

## 2016-07-06 DIAGNOSIS — R079 Chest pain, unspecified: Secondary | ICD-10-CM

## 2016-07-06 DIAGNOSIS — K219 Gastro-esophageal reflux disease without esophagitis: Secondary | ICD-10-CM | POA: Diagnosis not present

## 2016-07-06 MED ORDER — PANTOPRAZOLE SODIUM 40 MG PO TBEC: 40.0000 mg | DELAYED_RELEASE_TABLET | Freq: Every day | ORAL | 3 refills | Status: AC

## 2016-07-06 NOTE — Patient Instructions (Signed)
DASH Eating Plan DASH stands for "Dietary Approaches to Stop Hypertension." The DASH eating plan is a healthy eating plan that has been shown to reduce high blood pressure (hypertension). Additional health benefits may include reducing the risk of type 2 diabetes mellitus, heart disease, and stroke. The DASH eating plan may also help with weight loss. What do I need to know about the DASH eating plan? For the DASH eating plan, you will follow these general guidelines:  Choose foods with less than 150 milligrams of sodium per serving (as listed on the food label).  Use salt-free seasonings or herbs instead of table salt or sea salt.  Check with your health care provider or pharmacist before using salt substitutes.  Eat lower-sodium products. These are often labeled as "low-sodium" or "no salt added."  Eat fresh foods. Avoid eating a lot of canned foods.  Eat more vegetables, fruits, and low-fat dairy products.  Choose whole grains. Look for the word "whole" as the first word in the ingredient list.  Choose fish and skinless chicken or turkey more often than red meat. Limit fish, poultry, and meat to 6 oz (170 g) each day.  Limit sweets, desserts, sugars, and sugary drinks.  Choose heart-healthy fats.  Eat more home-cooked food and less restaurant, buffet, and fast food.  Limit fried foods.  Do not fry foods. Cook foods using methods such as baking, boiling, grilling, and broiling instead.  When eating at a restaurant, ask that your food be prepared with less salt, or no salt if possible. What foods can I eat? Seek help from a dietitian for individual calorie needs. Grains  Whole grain or whole wheat bread. Brown rice. Whole grain or whole wheat pasta. Quinoa, bulgur, and whole grain cereals. Low-sodium cereals. Corn or whole wheat flour tortillas. Whole grain cornbread. Whole grain crackers. Low-sodium crackers. Vegetables  Fresh or frozen vegetables (raw, steamed, roasted, or  grilled). Low-sodium or reduced-sodium tomato and vegetable juices. Low-sodium or reduced-sodium tomato sauce and paste. Low-sodium or reduced-sodium canned vegetables. Fruits  All fresh, canned (in natural juice), or frozen fruits. Meat and Other Protein Products  Ground beef (85% or leaner), grass-fed beef, or beef trimmed of fat. Skinless chicken or turkey. Ground chicken or turkey. Pork trimmed of fat. All fish and seafood. Eggs. Dried beans, peas, or lentils. Unsalted nuts and seeds. Unsalted canned beans. Dairy  Low-fat dairy products, such as skim or 1% milk, 2% or reduced-fat cheeses, low-fat ricotta or cottage cheese, or plain low-fat yogurt. Low-sodium or reduced-sodium cheeses. Fats and Oils  Tub margarines without trans fats. Light or reduced-fat mayonnaise and salad dressings (reduced sodium). Avocado. Safflower, olive, or canola oils. Natural peanut or almond butter. Other  Unsalted popcorn and pretzels. The items listed above may not be a complete list of recommended foods or beverages. Contact your dietitian for more options.  What foods are not recommended? Grains  White bread. White pasta. White rice. Refined cornbread. Bagels and croissants. Crackers that contain trans fat. Vegetables  Creamed or fried vegetables. Vegetables in a cheese sauce. Regular canned vegetables. Regular canned tomato sauce and paste. Regular tomato and vegetable juices. Fruits  Canned fruit in light or heavy syrup. Fruit juice. Meat and Other Protein Products  Fatty cuts of meat. Ribs, chicken wings, bacon, sausage, bologna, salami, chitterlings, fatback, hot dogs, bratwurst, and packaged luncheon meats. Salted nuts and seeds. Canned beans with salt. Dairy  Whole or 2% milk, cream, half-and-half, and cream cheese. Whole-fat or sweetened yogurt. Full-fat cheeses   or blue cheese. Nondairy creamers and whipped toppings. Processed cheese, cheese spreads, or cheese curds. Condiments  Onion and garlic  salt, seasoned salt, table salt, and sea salt. Canned and packaged gravies. Worcestershire sauce. Tartar sauce. Barbecue sauce. Teriyaki sauce. Soy sauce, including reduced sodium. Steak sauce. Fish sauce. Oyster sauce. Cocktail sauce. Horseradish. Ketchup and mustard. Meat flavorings and tenderizers. Bouillon cubes. Hot sauce. Tabasco sauce. Marinades. Taco seasonings. Relishes. Fats and Oils  Butter, stick margarine, lard, shortening, ghee, and bacon fat. Coconut, palm kernel, or palm oils. Regular salad dressings. Other  Pickles and olives. Salted popcorn and pretzels. The items listed above may not be a complete list of foods and beverages to avoid. Contact your dietitian for more information.  Where can I find more information? National Heart, Lung, and Blood Institute: CableProm[employer].itwww.nhlbi.nih.gov/health/health-topics/topics/dash/ This information is not intended to replace advice given to you by your health care provider. Make sure you discuss any questions you have with your health care provider. Document Released: 07/02/2011 Document Revised: 12/19/2015 Document Reviewed: 05/17/2013 Elsevier Interactive Patient Education  2017 [employer] Food Choices for Gastroesophageal Reflux Disease, Adult When you have gastroesophageal reflux disease (GERD), the foods you eat and your eating habits are very important. Choosing the right foods can help ease the discomfort of GERD. What general guidelines do I need to follow?  Choose fruits, vegetables, whole grains, low-fat dairy products, and low-fat meat, fish, and poultry.  Limit fats such as oils, salad dressings, butter, nuts, and avocado.  Keep a food diary to identify foods that cause symptoms.  Avoid foods that cause reflux. These may be different for different people.  Eat frequent small meals instead of three large meals each day.  Eat your meals slowly, in a relaxed setting.  Limit fried foods.  Cook foods using methods other than  frying.  Avoid drinking alcohol.  Avoid drinking large amounts of liquids with your meals.  Avoid bending over or lying down until 2-3 hours after eating. What foods are not recommended? The following are some foods and drinks that may worsen your symptoms: Vegetables  Tomatoes. Tomato juice. Tomato and spaghetti sauce. Chili peppers. Onion and garlic. Horseradish. Fruits  Oranges, grapefruit, and lemon (fruit and juice). Meats  High-fat meats, fish, and poultry. This includes hot dogs, ribs, ham, sausage, salami, and bacon. Dairy  Whole milk and chocolate milk. Sour cream. Cream. Butter. Ice cream. Cream cheese. Beverages  Coffee and tea, with or without caffeine. Carbonated beverages or energy drinks. Condiments  Hot sauce. Barbecue sauce. Sweets/Desserts  Chocolate and cocoa. Donuts. Peppermint and spearmint. Fats and Oils  High-fat foods, including [place] fries and potato chips. Other  Vinegar. Strong spices, such as black pepper, white pepper, red pepper, cayenne, curry powder, cloves, ginger, and chili powder. The items listed above may not be a complete list of foods and beverages to avoid. Contact your dietitian for more information.  This information is not intended to replace advice given to you by your health care provider. Make sure you discuss any questions you have with your health care provider. Document Released: 07/13/2005 Document Revised: 12/19/2015 Document Reviewed: 05/17/2013 Elsevier Interactive Patient Education  2017 [employer]

## 2016-07-06 NOTE — Progress Notes (Signed)
   Subjective:    Patient ID: Arthur Walsh, male    DOB: 01/25/1991, 25 y.o.   MRN: [id number]  Chest Pain   This is a new problem. The current episode started more than 1 month ago. The problem occurs intermittently. The problem has been unchanged. The pain is moderate. The quality of the pain is described as sharp. The pain does not radiate. Pertinent negatives include no abdominal pain, cough or weakness. Associated symptoms comments: edema. The pain is aggravated by nothing. He has tried antacids for the symptoms. The treatment provided no relief. There are no known risk factors.   He describes this as intermittent sharp pains in the center epigastric region that occurs when he lays down does not happen when he is exercising walking doing physical activity denies being stressed out does relate some reflux issues does not always eat healthy patient does smoking needs and he does know he needs to quit  Patient also relates having spells where he swells up for no good reason sometime the side of his pain sometimes lip sometime his hand he figured this was abnormal so therefore he set up an appointment to be seen his never had this before. Does not try anything he eats or drinks.   Review of Systems  Constitutional: Negative for activity change, appetite change and fatigue.  HENT: Negative for congestion.   Respiratory: Negative for cough.   Cardiovascular: Positive for chest pain.  Gastrointestinal: Negative for abdominal pain.  Endocrine: Negative for polydipsia and polyphagia.  Neurological: Negative for weakness.  Psychiatric/Behavioral: Negative for confusion.       Objective:   Physical Exam  Constitutional: He appears well-nourished. No distress.  Cardiovascular: Normal rate, regular rhythm and normal heart sounds.   No murmur heard. Pulmonary/Chest: Effort normal and breath sounds normal. No respiratory distress.  Musculoskeletal: He exhibits no edema.  Lymphadenopathy:   He has no cervical adenopathy.  Neurological: He is alert.  Psychiatric: His behavior is normal.  Vitals reviewed.   Family history obesity EKG was completed I see no sign of any heart disease I doubt that this is cardiac I do not recommend cardiology referral at this time. I do not feel this is pulmonary would not recommend x-ray at this time.     Assessment & Plan:  Chest pain noncardiac. Probably related to reflux. Try GERD medication. If that doesn't help watch diet keep better on a 6 inch block on the head if not doing better over the next few weeks to let us know  Patient encouraged to try to lose weight watch diet increase exercise  Angioedema referral to allergist for further evaluation Zyrtec everyday Benadryl when necessary warning signs discussed in detail  Patient encouraged quit smoking

## 2016-07-07 ENCOUNTER — Encounter: Payer: Self-pay | Admitting: Family Medicine

## 2016-10-21 ENCOUNTER — Ambulatory Visit: Payer: 59 | Admitting: Allergy

## 2020-02-15 ENCOUNTER — Other Ambulatory Visit: Payer: Self-pay

## 2020-02-15 ENCOUNTER — Emergency Department (HOSPITAL_COMMUNITY)
Admission: EM | Admit: 2020-02-15 | Discharge: 2020-02-16 | Disposition: A | Discharge status: Home / Self Care | Payer: Managed Care, Other (non HMO) | Attending: Emergency Medicine | Admitting: Emergency Medicine

## 2020-02-15 ENCOUNTER — Encounter (HOSPITAL_COMMUNITY): Payer: Self-pay | Admitting: *Deleted

## 2020-02-15 DIAGNOSIS — Z5321 Procedure and treatment not carried out due to patient leaving prior to being seen by health care provider: Secondary | ICD-10-CM | POA: Insufficient documentation

## 2020-02-15 DIAGNOSIS — R1084 Generalized abdominal pain: Secondary | ICD-10-CM | POA: Insufficient documentation

## 2020-02-15 NOTE — ED Triage Notes (Signed)
Pt c/o generalized abdominal pain that radiates around to his back; pt states his urine looked more orange today; pt has had some urgency

## 2020-04-19 ENCOUNTER — Other Ambulatory Visit: Payer: Self-pay | Admitting: Internal Medicine

## 2020-04-19 ENCOUNTER — Other Ambulatory Visit: Payer: Self-pay

## 2020-04-19 DIAGNOSIS — Z20822 Contact with and (suspected) exposure to covid-19: Secondary | ICD-10-CM

## 2020-04-20 LAB — SARS-COV-2, NAA 2 DAY TAT

## 2020-04-20 LAB — NOVEL CORONAVIRUS, NAA: SARS-CoV-2, NAA: NOT DETECTED
